# Patient Record
Sex: Male | Born: 1990 | Race: White | Hispanic: No | Marital: Single | Smoking: Never smoker
Health system: Southern US, Community
[De-identification: ages and names within clinical notes are randomized; demographics above are authoritative.]

## PROBLEM LIST (undated history)

## (undated) DIAGNOSIS — J45909 Unspecified asthma, uncomplicated: Secondary | ICD-10-CM

## (undated) HISTORY — DX: Unspecified asthma, uncomplicated: J45.909

---

## 2013-04-01 ENCOUNTER — Ambulatory Visit (INDEPENDENT_AMBULATORY_CARE_PROVIDER_SITE_OTHER): Payer: 59 | Admitting: Family Medicine

## 2013-04-01 ENCOUNTER — Ambulatory Visit (HOSPITAL_COMMUNITY)
Admission: RE | Admit: 2013-04-01 | Discharge: 2013-04-01 | Disposition: A | Discharge status: Home / Self Care | Payer: 59 | Source: Ambulatory Visit | Attending: Family Medicine | Admitting: Family Medicine

## 2013-04-01 ENCOUNTER — Encounter: Payer: Self-pay | Admitting: Family Medicine

## 2013-04-01 VITALS — BP 132/90 | Temp 97.8°F | Ht 68.0 in | Wt 257.0 lb

## 2013-04-01 DIAGNOSIS — R109 Unspecified abdominal pain: Secondary | ICD-10-CM

## 2013-04-01 LAB — CBC WITH DIFFERENTIAL/PLATELET
BASOS ABS: 0 10*3/uL (ref 0.0–0.1)
Basophils Relative: 0 % (ref 0–1)
Eosinophils Absolute: 0.1 10*3/uL (ref 0.0–0.7)
Eosinophils Relative: 1 % (ref 0–5)
HCT: 47.4 % (ref 39.0–52.0)
Hemoglobin: 16.7 g/dL (ref 13.0–17.0)
LYMPHS PCT: 31 % (ref 12–46)
Lymphs Abs: 2.1 10*3/uL (ref 0.7–4.0)
MCH: 29.5 pg (ref 26.0–34.0)
MCHC: 35.2 g/dL (ref 30.0–36.0)
MCV: 83.6 fL (ref 78.0–100.0)
Monocytes Absolute: 0.6 10*3/uL (ref 0.1–1.0)
Monocytes Relative: 8 % (ref 3–12)
NEUTROS PCT: 60 % (ref 43–77)
Neutro Abs: 4.2 10*3/uL (ref 1.7–7.7)
PLATELETS: 203 10*3/uL (ref 150–400)
RBC: 5.67 MIL/uL (ref 4.22–5.81)
RDW: 12.3 % (ref 11.5–15.5)
WBC: 7 10*3/uL (ref 4.0–10.5)

## 2013-04-01 LAB — HEPATIC FUNCTION PANEL
ALBUMIN: 4.4 g/dL (ref 3.5–5.2)
ALT: 32 U/L (ref 0–53)
AST: 24 U/L (ref 0–37)
Alkaline Phosphatase: 55 U/L (ref 39–117)
Bilirubin, Direct: <0.1 mg/dL (ref 0.0–0.3)
TOTAL PROTEIN: 7.7 g/dL (ref 6.0–8.3)
Total Bilirubin: 0.7 mg/dL (ref 0.3–1.2)

## 2013-04-01 LAB — AMYLASE: Amylase: 40 U/L (ref 0–105)

## 2013-04-01 LAB — LIPASE: Lipase: 24 U/L (ref 11–59)

## 2013-04-01 MED ORDER — PANTOPRAZOLE SODIUM 40 MG PO TBEC: 40.0000 mg | DELAYED_RELEASE_TABLET | Freq: Every day | ORAL | Status: AC

## 2013-04-01 NOTE — Progress Notes (Signed)
   Subjective:    Patient ID: Troy Mcdonald, male    DOB: 1990-10-30, 23 y.o.   MRN: [id number]  Abdominal Pain This is a recurrent problem. The pain is located in the RUQ. The quality of the pain is sharp.   Started in the past yr,  Crops up intermittently, right upper abd cost margin discomfort  Had supper then pain cropped up  Did not affect ability to sleep  No sig nausea,  No heartburn or reflux,  Rare use antiinflammatories  Running ant pain at times too  Ruptured appendix hx. within the family.  Does report intermittent significant alcohol intake but only once per week or so. However at that time often drinking as many as 10 beers.  Pain lasts minutes usually  freq lifting     Review of Systems  Gastrointestinal: Positive for abdominal pain.   no chest pain no back pain no weight loss no change in bowel habits no vomiting no blood in stools ROS otherwise negative     Objective:   Physical Exam  Alert no acute distress obesity present HEENT normal. Vital stable. Blood pressure improved a repeat 04/02/1980. Lungs clear. Heart regular in rhythm. H&T normal. Abdominal exam right upper quadrant tenderness no rebound no guarding      Assessment & Plan:  Impression subacute right upper quadrant pain with intermittent flares. May represent duodenitis. Could represent gallbladder disease. Liver pancreas concerns also present. Doubt irritable bowel syndrome. Plan trial protonic [place]. Right upper quadrant ultrasound. Appropriate blood work. Rationale discussed with patient. WSL

## 2013-04-01 NOTE — Progress Notes (Signed)
Spoke with patient and she verbalized understanding of test results. 

## 2013-04-22 ENCOUNTER — Ambulatory Visit (INDEPENDENT_AMBULATORY_CARE_PROVIDER_SITE_OTHER): Payer: 59 | Admitting: Family Medicine

## 2013-04-22 ENCOUNTER — Encounter: Payer: Self-pay | Admitting: Family Medicine

## 2013-04-22 VITALS — BP 118/80 | Ht 68.0 in | Wt 255.0 lb

## 2013-04-22 DIAGNOSIS — K219 Gastro-esophageal reflux disease without esophagitis: Secondary | ICD-10-CM

## 2013-04-22 NOTE — Progress Notes (Signed)
   Subjective:    Patient ID: Troy Mcdonald, male    DOB: Nov 19, 1990, 23 y.o.   MRN: [id number]  HPI patient is here today for a f/u on his abdominal pain.  He was prescribed Protonix and no longer has any more abdominal pain.  Patient ultrasound and blood work which was negative. Reports his abdominal pain is much better. In fact has resolved.  He is wondering whether it may have been a muscle strain. However he had true epigastric tenderness. Which appear to improve after protonic.  Rare reflux symptoms at this time.  No concerns.     Review of Systems No vomiting no headache no chest pain no abdominal pain no back pain ROS otherwise negative    Objective:   Physical Exam  Alert no apparent distress. H&T normal. Lungs clear. Heart regular in rhythm. Abdomen benign.      Assessment & Plan:  Impression gastritis resolved plan change protonic states a when necessary. Since Medicare discussed. Warning signs discussed. WSL

## 2013-04-24 DIAGNOSIS — K219 Gastro-esophageal reflux disease without esophagitis: Secondary | ICD-10-CM | POA: Insufficient documentation

## 2013-06-02 ENCOUNTER — Encounter: Payer: Self-pay | Admitting: Family Medicine

## 2013-06-02 ENCOUNTER — Ambulatory Visit (INDEPENDENT_AMBULATORY_CARE_PROVIDER_SITE_OTHER): Payer: 59 | Admitting: Family Medicine

## 2013-06-02 VITALS — BP 120/84 | Temp 98.3°F | Ht 67.0 in | Wt 239.0 lb

## 2013-06-02 DIAGNOSIS — M549 Dorsalgia, unspecified: Secondary | ICD-10-CM

## 2013-06-02 LAB — POCT URINALYSIS DIPSTICK
Spec Grav, UA: 1.02
pH, UA: 5

## 2013-06-02 MED ORDER — ONDANSETRON 4 MG PO TBDP: 4.0000 mg | ORAL_TABLET | Freq: Three times a day (TID) | ORAL | Status: AC | PRN

## 2013-06-02 NOTE — Progress Notes (Signed)
   Subjective:    Patient ID: Troy Mcdonald, male    DOB: 09/08/1990, 23 y.o.   MRN: [id number]  HPI Comments: Pt also c/o of sore lower back and possible blood in urine.  Urine dipstick looked normal.  Fever  This is a new problem. The current episode started yesterday. The maximum temperature noted was 100 to 100.9 F. The temperature was taken using an oral thermometer. Associated symptoms include diarrhea, muscle aches and nausea. He has tried NSAIDs for the symptoms. The treatment provided moderate relief.   Felt nauseated and diminished energy  Felt bad nausea  tmax 100.4  No vom  Had a fair amnt of diaarhea  Felt achey al over head and joints   Low back discomfor t and felt achey low back  Results for orders placed in visit on 06/02/13  POCT URINALYSIS DIPSTICK      Result Value Ref Range   Color, UA       Clarity, UA       Glucose, UA       Bilirubin, UA       Ketones, UA       Spec Grav, UA 1.020     Blood, UA       pH, UA 5.0     Protein, UA       Urobilinogen, UA       Nitrite, UA       Leukocytes, UA          Review of Systems  Constitutional: Positive for fever.  Gastrointestinal: Positive for nausea and diarrhea.       Objective:   Physical Exam  Alert slight malaise. HEENT normal. Lungs clear. Heart regular in rhythm. Abdomen benign.  Urinalysis negative.      Assessment & Plan:  Impression viral gastroenteritis. With slight flare of reflux. Plan Zofran when necessary for nausea. Warning signs discussed. Diet discussed. WSL

## 2015-10-15 IMAGING — US US ABDOMEN LIMITED
1 series · 14 of 25 positions shown · non-contrast
Comparison: None.

CLINICAL DATA: Upper abdominal pain

EXAM:
US ABDOMEN LIMITED - RIGHT UPPER QUADRANT

[Series 1: us abdomen limited · 0.26mm/px · 14 of 47 slices shown]
[im 1/47]
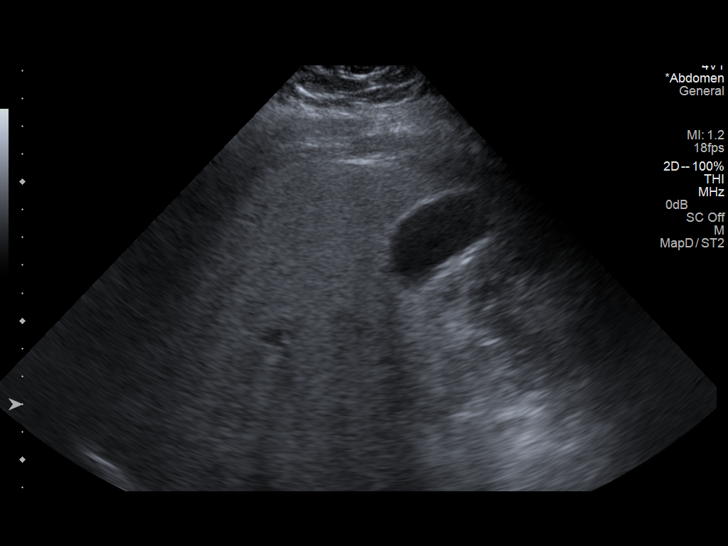
[im 4/47]
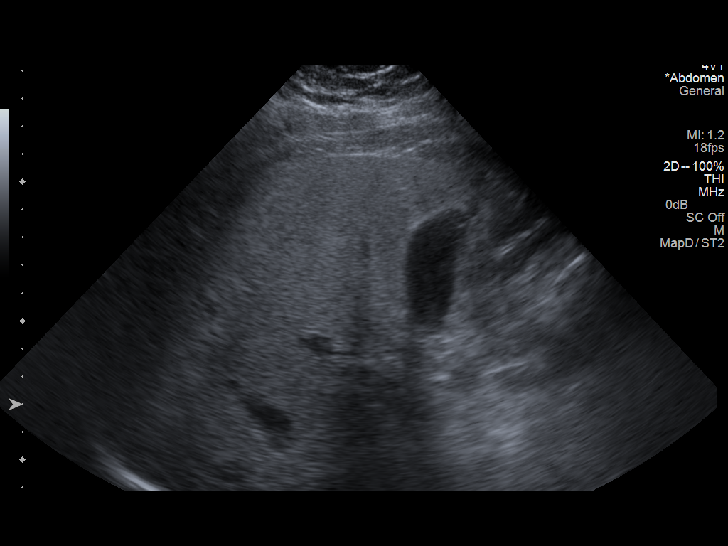
[im 8/47]
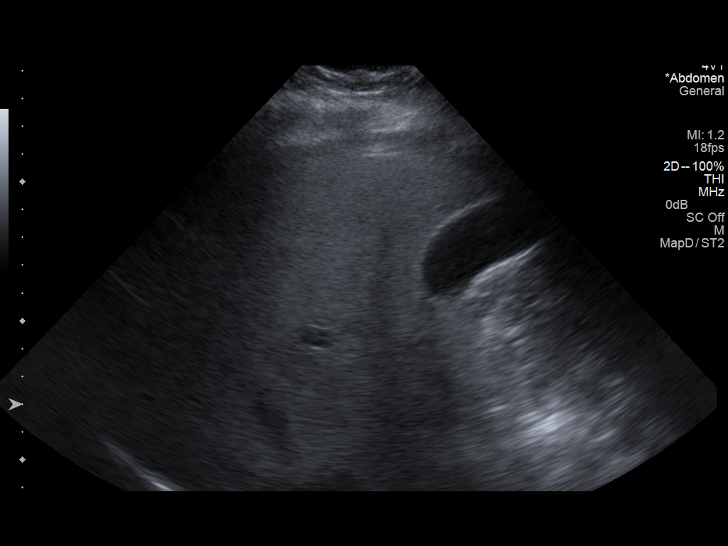
[im 12/47]
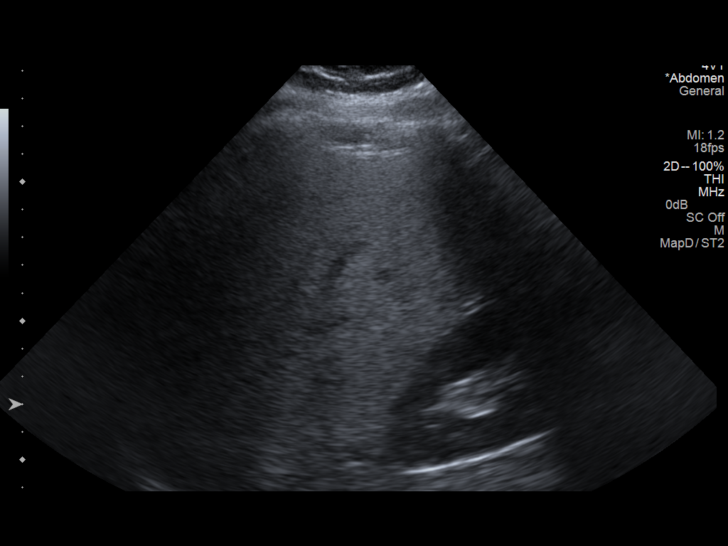
[im 16/47]
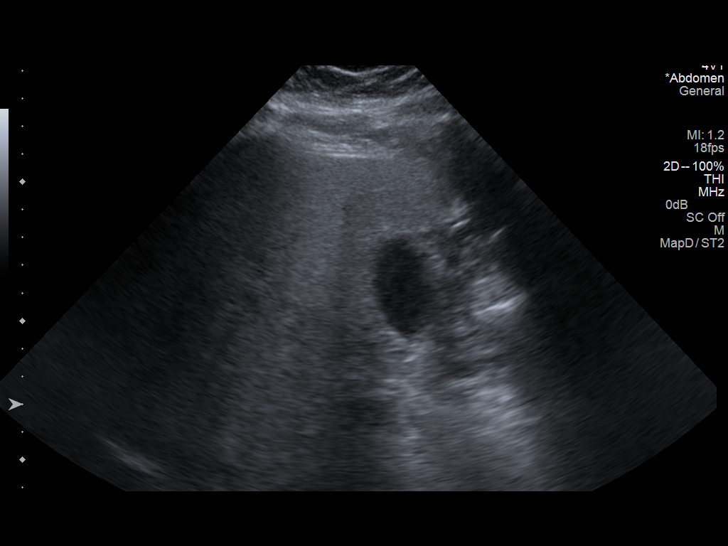
[im 18/47]
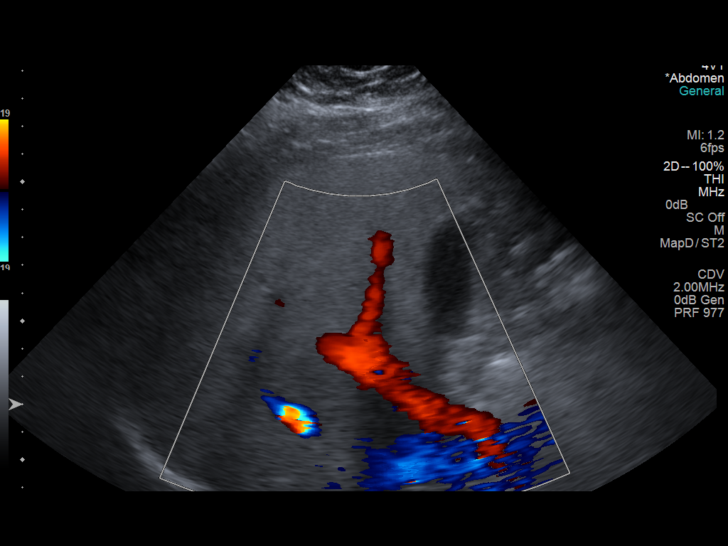
[im 22/47]
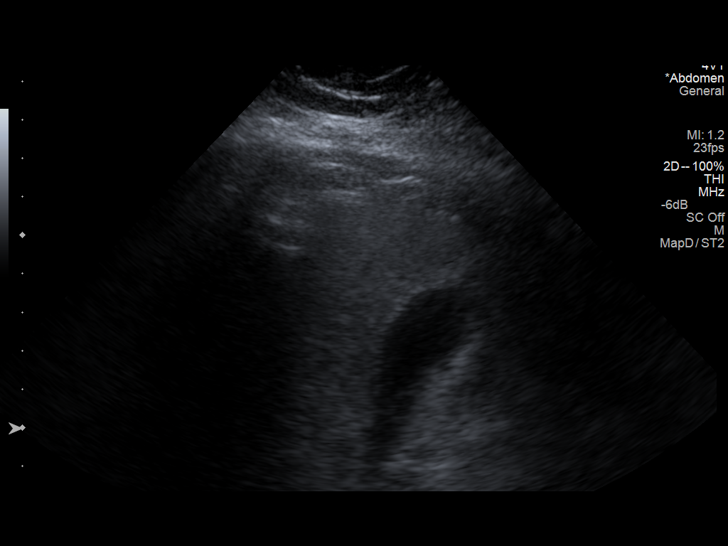
[im 25/47]
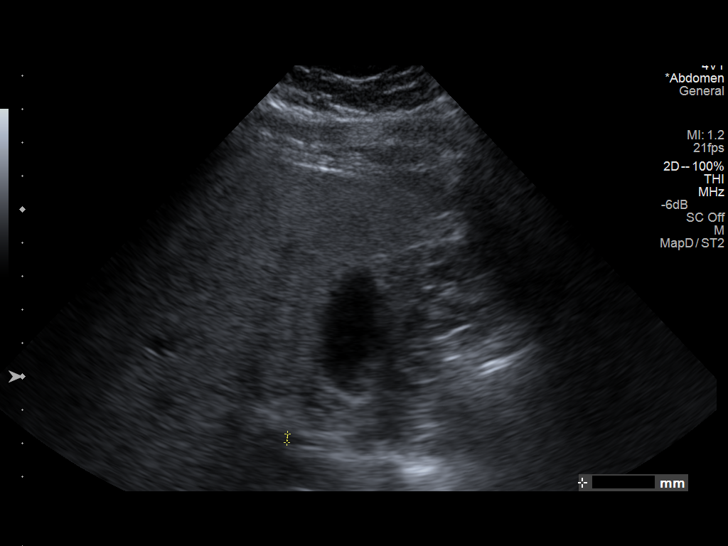
[im 29/47]
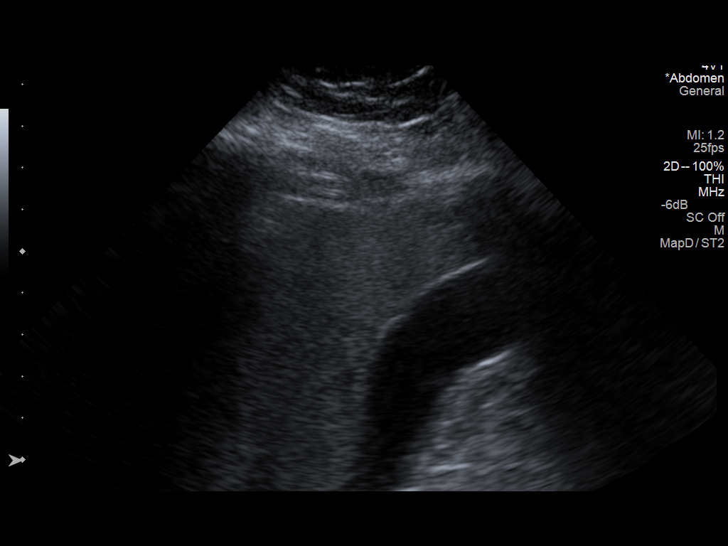
[im 31/47]
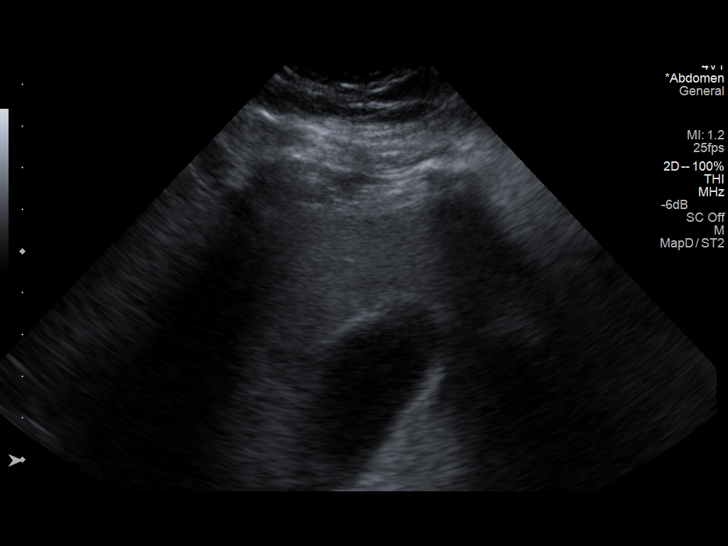
[im 35/47]
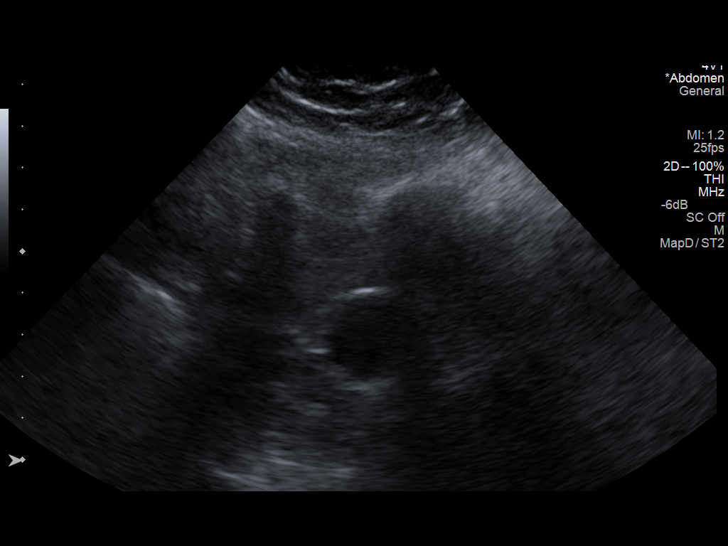
[im 39/47]
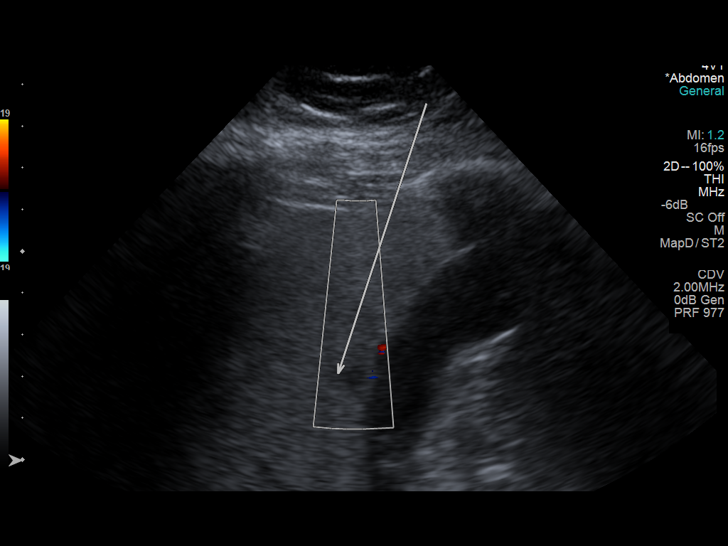
[im 43/47]
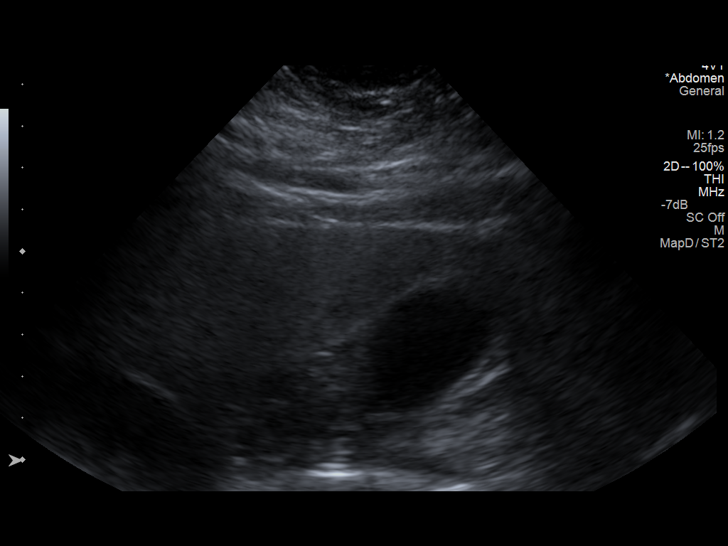
[im 47/47]
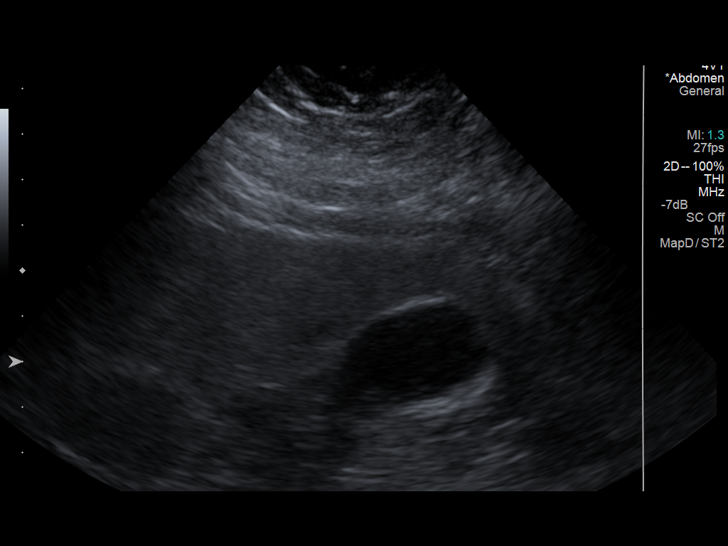

[14 of 25 positions shown; findings below may reference images not displayed]

FINDINGS: Gallbladder:

No gallstones or wall thickening visualized. There is no
pericholecystic fluid. No sonographic Murphy sign noted.

Common bile duct:

Diameter: 3 mm. There is no intrahepatic or extrahepatic biliary
duct dilatation.

Liver:

The liver shows increased echogenicity. No focal lesion identified
in the liver.
IMPRESSION: Increased liver echogenicity, most likely indicative of fatty
change. While no focal liver lesions are identified, it must be
cautioned that the sensitivity of ultrasound for focal liver lesions
is diminished given underlying fatty change. Study otherwise
unremarkable.

## 2019-04-11 ENCOUNTER — Encounter: Payer: Self-pay | Admitting: Family Medicine

## 2022-03-12 ENCOUNTER — Telehealth: Payer: Self-pay | Admitting: Nurse Practitioner

## 2022-03-12 DIAGNOSIS — J40 Bronchitis, not specified as acute or chronic: Secondary | ICD-10-CM

## 2022-03-12 MED ORDER — ALBUTEROL SULFATE HFA 108 (90 BASE) MCG/ACT IN AERS: 2.0000 | INHALATION_SPRAY | Freq: Four times a day (QID) | RESPIRATORY_TRACT | 0 refills | Status: AC | PRN

## 2022-03-12 MED ORDER — DOXYCYCLINE HYCLATE 100 MG PO TABS: 100.0000 mg | ORAL_TABLET | Freq: Two times a day (BID) | ORAL | 0 refills | Status: AC

## 2022-03-12 MED ORDER — BENZONATATE 100 MG PO CAPS: 100.0000 mg | ORAL_CAPSULE | Freq: Three times a day (TID) | ORAL | 0 refills | Status: AC | PRN

## 2022-03-12 NOTE — Progress Notes (Signed)
We are sorry that you are not feeling well.  Here is how we plan to help!  Based on your presentation I believe you most likely have A cough due to bacteria.  When patients have a fever and a productive cough with a change in color or increased sputum production, we are concerned about bacterial bronchitis.  If left untreated it can progress to pneumonia.  If your symptoms do not improve with your treatment plan it is important that you contact your provider.   I have prescribed Doxycycline 100 mg twice a day for 7 days     In addition you may use A prescription cough medication called Tessalon Perles 100mg . You may take 1-2 capsules every 8 hours as needed for your cough.  We will also send in a prescription for an Albuterol inhaler to help with your cough.  Meds ordered this encounter  Medications   doxycycline (VIBRA-TABS) 100 MG tablet    Sig: Take 1 tablet (100 mg total) by mouth 2 (two) times daily for 7 days.    Dispense:  14 tablet    Refill:  0   benzonatate (TESSALON) 100 MG capsule    Sig: Take 1 capsule (100 mg total) by mouth 3 (three) times daily as needed.    Dispense:  30 capsule    Refill:  0   albuterol (VENTOLIN HFA) 108 (90 Base) MCG/ACT inhaler    Sig: Inhale 2 puffs into the lungs every 6 (six) hours as needed for wheezing or shortness of breath.    Dispense:  8 g    Refill:  0     From your responses in the eVisit questionnaire you describe inflammation in the upper respiratory tract which is causing a significant cough.  This is commonly called Bronchitis and has four common causes:   Allergies Viral Infections Acid Reflux Bacterial Infection Allergies, viruses and acid reflux are treated by controlling symptoms or eliminating the cause. An example might be a cough caused by taking certain blood pressure medications. You stop the cough by changing the medication. Another example might be a cough caused by acid reflux. Controlling the reflux helps control the  cough.  USE OF BRONCHODILATOR ("RESCUE") INHALERS: There is a risk from using your bronchodilator too frequently.  The risk is that over-reliance on a medication which only relaxes the muscles surrounding the breathing tubes can reduce the effectiveness of medications prescribed to reduce swelling and congestion of the tubes themselves.  Although you feel brief relief from the bronchodilator inhaler, your asthma may actually be worsening with the tubes becoming more swollen and filled with mucus.  This can delay other crucial treatments, such as oral steroid medications. If you need to use a bronchodilator inhaler daily, several times per day, you should discuss this with your provider.  There are probably better treatments that could be used to keep your asthma under control.     HOME CARE Only take medications as instructed by your medical team. Complete the entire course of an antibiotic. Drink plenty of fluids and get plenty of rest. Avoid close contacts especially the very young and the elderly Cover your mouth if you cough or cough into your sleeve. Always remember to wash your hands A steam or ultrasonic humidifier can help congestion.   GET HELP RIGHT AWAY IF: You develop worsening fever. You become short of breath You cough up blood. Your symptoms persist after you have completed your treatment plan MAKE SURE YOU  Understand these  instructions. Will watch your condition. Will get help right away if you are not doing well or get worse.    Thank you for choosing an e-visit.  Your e-visit answers were reviewed by a board certified advanced clinical practitioner to complete your personal care plan. Depending upon the condition, your plan could have included both over the counter or prescription medications.  Please review your pharmacy choice. Make sure the pharmacy is open so you can pick up prescription now. If there is a problem, you may contact your provider through [employer] and have the prescription routed to another pharmacy.  Your safety is important to [place]. If you have drug allergies check your prescription carefully.   For the next 24 hours you can use MyChart to ask questions about today's visit, request a non-urgent call back, or ask for a work or school excuse. You will get an email in the next two days asking about your experience. I hope that your e-visit has been valuable and will speed your recovery.   I spent approximately 5 minutes reviewing the patient's history, current symptoms and coordinating their care today.

## 2022-04-08 ENCOUNTER — Other Ambulatory Visit: Payer: Self-pay | Admitting: Nurse Practitioner

## 2022-04-08 DIAGNOSIS — J40 Bronchitis, not specified as acute or chronic: Secondary | ICD-10-CM

## 2023-05-01 ENCOUNTER — Telehealth: Payer: BLUE CROSS/BLUE SHIELD | Admitting: Family Medicine

## 2023-05-01 DIAGNOSIS — J40 Bronchitis, not specified as acute or chronic: Secondary | ICD-10-CM

## 2023-05-01 DIAGNOSIS — J069 Acute upper respiratory infection, unspecified: Secondary | ICD-10-CM

## 2023-05-01 MED ORDER — PREDNISONE 20 MG PO TABS: 20.0000 mg | ORAL_TABLET | Freq: Two times a day (BID) | ORAL | 0 refills | Status: AC

## 2023-05-01 MED ORDER — BENZONATATE 200 MG PO CAPS: 200.0000 mg | ORAL_CAPSULE | Freq: Two times a day (BID) | ORAL | 0 refills | Status: AC | PRN

## 2023-05-01 MED ORDER — AZITHROMYCIN 250 MG PO TABS: ORAL_TABLET | ORAL | 0 refills | Status: AC

## 2023-05-01 NOTE — Progress Notes (Signed)
 E-Visit for Cough   We are sorry that you are not feeling well.  Here is how we plan to help!  Based on your presentation I believe you most likely have A cough due to a virus.  This is called viral bronchitis and is best treated by rest, plenty of fluids and control of the cough.  You may use Ibuprofen or Tylenol as directed to help your symptoms.     In addition you may use A prescription cough medication called Tessalon Perles 100mg . You may take 1-2 capsules every 8 hours as needed for your cough.  I have also sent prednisone.   From your responses in the eVisit questionnaire you describe inflammation in the upper respiratory tract which is causing a significant cough.  This is commonly called Bronchitis and has four common causes:   Allergies Viral Infections Acid Reflux Bacterial Infection Allergies, viruses and acid reflux are treated by controlling symptoms or eliminating the cause. An example might be a cough caused by taking certain blood pressure medications. You stop the cough by changing the medication. Another example might be a cough caused by acid reflux. Controlling the reflux helps control the cough.  USE OF BRONCHODILATOR ("RESCUE") INHALERS: There is a risk from using your bronchodilator too frequently.  The risk is that over-reliance on a medication which only relaxes the muscles surrounding the breathing tubes can reduce the effectiveness of medications prescribed to reduce swelling and congestion of the tubes themselves.  Although you feel brief relief from the bronchodilator inhaler, your asthma may actually be worsening with the tubes becoming more swollen and filled with mucus.  This can delay other crucial treatments, such as oral steroid medications. If you need to use a bronchodilator inhaler daily, several times per day, you should discuss this with your provider.  There are probably better treatments that could be used to keep your asthma under control.     HOME  CARE Only take medications as instructed by your medical team. Complete the entire course of an antibiotic. Drink plenty of fluids and get plenty of rest. Avoid close contacts especially the very young and the elderly Cover your mouth if you cough or cough into your sleeve. Always remember to wash your hands A steam or ultrasonic humidifier can help congestion.   GET HELP RIGHT AWAY IF: You develop worsening fever. You become short of breath You cough up blood. Your symptoms persist after you have completed your treatment plan MAKE SURE YOU  Understand these instructions. Will watch your condition. Will get help right away if you are not doing well or get worse.    Thank you for choosing an e-visit.  Your e-visit answers were reviewed by a board certified advanced clinical practitioner to complete your personal care plan. Depending upon the condition, your plan could have included both over the counter or prescription medications.  Please review your pharmacy choice. Make sure the pharmacy is open so you can pick up prescription now. If there is a problem, you may contact your provider through [employer] and have the prescription routed to another pharmacy.  Your safety is important to [place]. If you have drug allergies check your prescription carefully.   For the next 24 hours you can use MyChart to ask questions about today's visit, request a non-urgent call back, or ask for a work or school excuse. You will get an email in the next two days asking about your experience. I hope that your e-visit has been  valuable and will speed your recovery.    have provided 5 minutes of non face to face time during this encounter for chart review and documentation.

## 2023-05-01 NOTE — Addendum Note (Signed)
 Addended by: Georgana Curio on: 05/01/2023 11:36 AM   Modules accepted: Orders

## 2023-12-12 ENCOUNTER — Telehealth: Admitting: Nurse Practitioner

## 2023-12-12 DIAGNOSIS — J028 Acute pharyngitis due to other specified organisms: Secondary | ICD-10-CM | POA: Diagnosis not present

## 2023-12-12 DIAGNOSIS — B9689 Other specified bacterial agents as the cause of diseases classified elsewhere: Secondary | ICD-10-CM

## 2023-12-12 MED ORDER — AMOXICILLIN 500 MG PO CAPS: 500.0000 mg | ORAL_CAPSULE | Freq: Two times a day (BID) | ORAL | 0 refills | Status: AC

## 2023-12-12 NOTE — Progress Notes (Signed)
 We are sorry that you are not feeling well.  Here is how we plan to help!  Based on what you have shared with me it is likely that you have strep pharyngitis.  Strep pharyngitis is inflammation and infection in the back of the throat.  This is an infection cause by bacteria and is treated with antibiotics.  I have prescribed Amoxicillin  500 mg twice a day for 10 days. For throat pain, we recommend over the counter oral pain relief medications such as acetaminophen or aspirin, or anti-inflammatory medications such as ibuprofen or naproxen sodium. Topical treatments such as oral throat lozenges or sprays may be used as needed. Strep infections are not as easily transmitted as other respiratory infections, however we still recommend that you avoid close contact with loved ones, especially the very young and elderly.  Remember to wash your hands thoroughly throughout the day as this is the number one way to prevent the spread of infection and wipe down door knobs and counters with disinfectant.   Home Care: Only take medications as instructed by your medical team. Complete the entire course of an antibiotic. Do not take these medications with alcohol. A steam or ultrasonic humidifier can help congestion.  You can place a towel over your head and breathe in the steam from hot water coming from a faucet. Avoid close contacts especially the very young and the elderly. Cover your mouth when you cough or sneeze. Always remember to wash your hands.  Get Help Right Away If: You develop worsening fever or sinus pain. You develop a severe head ache or visual changes. Your symptoms persist after you have completed your treatment plan.  Make sure you Understand these instructions. Will watch your condition. Will get help right away if you are not doing well or get worse.  Your e-visit answers were reviewed by a board certified advanced clinical practitioner to complete your personal care plan.  Depending on  the condition, your plan could have included both over the counter or prescription medications.  If there is a problem please reply  once you have received a response from your provider.  Your safety is important to us .  If you have drug allergies check your prescription carefully.    You can use MyChart to ask questions about today's visit, request a non-urgent call back, or ask for a work or school excuse for 24 hours related to this e-Visit. If it has been greater than 24 hours you will need to follow up with your provider, or enter a new e-Visit to address those concerns.  You will get an e-mail in the next two days asking about your experience.  I hope that your e-visit has been valuable and will speed your recovery. Thank you for using e-visits.   I have spent 5 minutes in review of e-visit questionnaire, review and updating patient chart, medical decision making and response to patient.   Zo Loudon W Collis Thede, NP
